# Patient Record
Sex: Male | Born: 1990 | Race: White | Hispanic: No | Marital: Single | Smoking: Never smoker
Health system: Southern US, Community
[De-identification: ages and names within clinical notes are randomized; demographics above are authoritative.]

## PROBLEM LIST (undated history)

## (undated) DIAGNOSIS — F419 Anxiety disorder, unspecified: Secondary | ICD-10-CM

## (undated) DIAGNOSIS — F32A Depression, unspecified: Secondary | ICD-10-CM

## (undated) DIAGNOSIS — E7800 Pure hypercholesterolemia, unspecified: Secondary | ICD-10-CM

## (undated) DIAGNOSIS — N200 Calculus of kidney: Secondary | ICD-10-CM

## (undated) DIAGNOSIS — I10 Essential (primary) hypertension: Secondary | ICD-10-CM

## (undated) HISTORY — DX: Calculus of kidney: N20.0

## (undated) HISTORY — PX: TESTICLE SURGERY: SHX794

## (undated) HISTORY — DX: Morbid (severe) obesity due to excess calories: E66.01

---

## 2020-11-06 ENCOUNTER — Emergency Department (HOSPITAL_BASED_OUTPATIENT_CLINIC_OR_DEPARTMENT_OTHER): Payer: Worker's Compensation

## 2020-11-06 ENCOUNTER — Other Ambulatory Visit: Payer: Self-pay

## 2020-11-06 ENCOUNTER — Emergency Department (HOSPITAL_BASED_OUTPATIENT_CLINIC_OR_DEPARTMENT_OTHER)
Admission: EM | Admit: 2020-11-06 | Discharge: 2020-11-06 | Disposition: A | Discharge status: Home / Self Care | Payer: Worker's Compensation | Attending: Emergency Medicine | Admitting: Emergency Medicine

## 2020-11-06 ENCOUNTER — Encounter (HOSPITAL_BASED_OUTPATIENT_CLINIC_OR_DEPARTMENT_OTHER): Payer: Self-pay

## 2020-11-06 DIAGNOSIS — W000XXA Fall on same level due to ice and snow, initial encounter: Secondary | ICD-10-CM | POA: Insufficient documentation

## 2020-11-06 DIAGNOSIS — M25532 Pain in left wrist: Secondary | ICD-10-CM | POA: Insufficient documentation

## 2020-11-06 DIAGNOSIS — M79602 Pain in left arm: Secondary | ICD-10-CM | POA: Insufficient documentation

## 2020-11-06 DIAGNOSIS — Y990 Civilian activity done for income or pay: Secondary | ICD-10-CM | POA: Insufficient documentation

## 2020-11-06 DIAGNOSIS — I10 Essential (primary) hypertension: Secondary | ICD-10-CM | POA: Insufficient documentation

## 2020-11-06 DIAGNOSIS — M79632 Pain in left forearm: Secondary | ICD-10-CM | POA: Insufficient documentation

## 2020-11-06 DIAGNOSIS — M25512 Pain in left shoulder: Secondary | ICD-10-CM | POA: Insufficient documentation

## 2020-11-06 DIAGNOSIS — Y9289 Other specified places as the place of occurrence of the external cause: Secondary | ICD-10-CM | POA: Diagnosis not present

## 2020-11-06 DIAGNOSIS — W19XXXA Unspecified fall, initial encounter: Secondary | ICD-10-CM

## 2020-11-06 HISTORY — DX: Anxiety disorder, unspecified: F41.9

## 2020-11-06 HISTORY — DX: Essential (primary) hypertension: I10

## 2020-11-06 HISTORY — DX: Pure hypercholesterolemia, unspecified: E78.00

## 2020-11-06 HISTORY — DX: Depression, unspecified: F32.A

## 2020-11-06 MED ORDER — METHOCARBAMOL 500 MG PO TABS: 500.0000 mg | ORAL_TABLET | Freq: Two times a day (BID) | ORAL | 0 refills | Status: AC

## 2020-11-06 NOTE — ED Notes (Signed)
UDS completed 

## 2020-11-06 NOTE — ED Provider Notes (Signed)
[facility] EMERGENCY DEPARTMENT Provider Note   CSN: [id number] Arrival date & time: 11/06/20  0931     History Chief Complaint  Patient presents with  . Arm Pain  . Arm Injury    George Henry is a 30 y.o. male with pertinent past medical history of anxiety, depression, hypertension the presents the emergency department today for fall.  Patient states that he slipped on the ice this morning try to get out to his car, FOOSH injury.  Patient states that he extended his left arm and is  currently having pain in his left wrist, forearm and shoulder.  Denies any blood thinners.  Did not hit his head.  No LOC.  No prior injury to this area.  Did not hit his back.  No other complaints at this time.  Denies any numbness or tingling down to his arm.  Also asking for urine drug screen at this time for work.    HPI     Past Medical History:  Diagnosis Date  . Anxiety   . Depression   . Hypercholesteremia   . Hypertension     There are no problems to display for this patient.   Past Surgical History:  Procedure Laterality Date  . TESTICLE SURGERY         History reviewed. No pertinent family history.  Social History   Tobacco Use  . Smoking status: Never Smoker  . Smokeless tobacco: Never Used  Vaping Use  . Vaping Use: Never used  Substance Use Topics  . Alcohol use: Not Currently  . Drug use: Not Currently    Home Medications Prior to Admission medications   Medication Sig Start Date End Date Taking? Authorizing Provider  methocarbamol (ROBAXIN) 500 MG tablet Take 1 tablet (500 mg total) by mouth 2 (two) times daily. 11/06/20  Yes Farrel Gordon, PA-C    Allergies    Patient has no allergy information on record.  Review of Systems   Review of Systems  Constitutional: Negative for diaphoresis, fatigue and fever.  Eyes: Negative for visual disturbance.  Respiratory: Negative for shortness of breath.   Cardiovascular: Negative for chest pain.   Gastrointestinal: Negative for nausea and vomiting.  Musculoskeletal: Positive for myalgias. Negative for back pain.  Skin: Negative for color change, pallor, rash and wound.  Neurological: Negative for syncope, weakness, light-headedness, numbness and headaches.  Psychiatric/Behavioral: Negative for behavioral problems and confusion.    Physical Exam Updated Vital Signs BP (!) 170/103 (BP Location: Right Arm)   Pulse (!) 106   Temp 98.2 F (36.8 C) (Oral)   Resp 18   Ht 5\' 5"  (1.651 m)   Wt 108.9 kg   SpO2 100%   BMI 39.94 kg/m   Physical Exam Constitutional:      General: He is not in acute distress.    Appearance: Normal appearance. He is not ill-appearing, toxic-appearing or diaphoretic.  HENT:     Head: Normocephalic and atraumatic.  Cardiovascular:     Rate and Rhythm: Normal rate and regular rhythm.     Pulses: Normal pulses.  Pulmonary:     Effort: Pulmonary effort is normal. No respiratory distress.     Breath sounds: Normal breath sounds.  Musculoskeletal:        General: Normal range of motion.       Back:     Comments: Some tenderness to region depicted above. Left upper arm with normal range of motion to shoulder, elbow, wrist and fingers.  Normal  strength throughout, with normal range of motion without any pain.  Slight discomfort to forearm.  No edema noted.  No ecchymosis noted.  Normal sensation throughout.  Cap refill less than 2 seconds on each digit.  Radial pulse 2+.  Skin:    General: Skin is warm and dry.     Capillary Refill: Capillary refill takes less than 2 seconds.  Neurological:     General: No focal deficit present.     Mental Status: He is alert and oriented to person, place, and time.     Cranial Nerves: No cranial nerve deficit.     Sensory: No sensory deficit.     Motor: No weakness.  Psychiatric:        Mood and Affect: Mood normal.        Behavior: Behavior normal.        Thought Content: Thought content normal.     ED  Results / Procedures / Treatments   Labs (all labs ordered are listed, but only abnormal results are displayed) Labs Reviewed  RAPID URINE DRUG SCREEN, HOSP PERFORMED    EKG None  Radiology DG Forearm Left  Result Date: 11/06/2020 CLINICAL DATA:  Fall with left arm pain EXAM: LEFT FOREARM - 2 VIEW; LEFT WRIST - COMPLETE 3+ VIEW COMPARISON:  None. FINDINGS: There is no evidence of fracture or other focal bone lesions. No significant arthropathy. Soft tissue swelling of the mid forearm. IMPRESSION: No osseous abnormality of the left forearm or wrist. Soft tissue swelling of the mid forearm. Electronically Signed   By: Duanne Guess D.O.   On: 11/06/2020 10:27   DG Wrist Complete Left  Result Date: 11/06/2020 CLINICAL DATA:  Fall with left arm pain EXAM: LEFT FOREARM - 2 VIEW; LEFT WRIST - COMPLETE 3+ VIEW COMPARISON:  None. FINDINGS: There is no evidence of fracture or other focal bone lesions. No significant arthropathy. Soft tissue swelling of the mid forearm. IMPRESSION: No osseous abnormality of the left forearm or wrist. Soft tissue swelling of the mid forearm. Electronically Signed   By: Duanne Guess D.O.   On: 11/06/2020 10:27   DG Shoulder Left  Result Date: 11/06/2020 CLINICAL DATA:  Fall with left arm pain EXAM: LEFT HUMERUS - 2+ VIEW; LEFT SHOULDER - 2+ VIEW COMPARISON:  None. FINDINGS: There is no evidence of fracture or other focal bone lesions. No significant arthropathy. Soft tissues are unremarkable. IMPRESSION: No acute osseous abnormality of the left shoulder or humerus. Electronically Signed   By: Duanne Guess D.O.   On: 11/06/2020 10:29   DG Humerus Left  Result Date: 11/06/2020 CLINICAL DATA:  Fall with left arm pain EXAM: LEFT HUMERUS - 2+ VIEW; LEFT SHOULDER - 2+ VIEW COMPARISON:  None. FINDINGS: There is no evidence of fracture or other focal bone lesions. No significant arthropathy. Soft tissues are unremarkable. IMPRESSION: No acute osseous abnormality of  the left shoulder or humerus. Electronically Signed   By: Duanne Guess D.O.   On: 11/06/2020 10:29    Procedures Procedures (including critical care time)  Medications Ordered in ED Medications - No data to display  ED Course  I have reviewed the triage vital signs and the nursing notes.  Pertinent labs & imaging results that were available during my care of the patient were reviewed by me and considered in my medical decision making (see chart for details).    MDM Rules/Calculators/A&P  George Henry is a 30 y.o. male with pertinent past medical history of anxiety, depression, hypertension the presents the emergency department today for fall.  Plain films negative, patient is distally neurovascular intact with normal range of motion.  No tenderness to wrist, slight tenderness to forearm.  Do not think patient has a sprain, patient has no pain when he moves his wrist.  Did apply some Ace wrap to forearm.  Did not hit his head.  RICE therapy discussed, will prescribe Robaxin for muscle aches.  Patient will follow-up with sports medicine.  Doubt need for further emergent work up at this time. I explained the diagnosis and have given explicit precautions to return to the ER including for any other new or worsening symptoms. The patient understands and accepts the medical plan as it's been dictated and I have answered their questions. Discharge instructions concerning home care and prescriptions have been given. The patient is STABLE and is discharged to home in good condition.  Final Clinical Impression(s) / ED Diagnoses Final diagnoses:  Fall, initial encounter    Rx / DC Orders ED Discharge Orders         Ordered    methocarbamol (ROBAXIN) 500 MG tablet  2 times daily        11/06/20 1122           Farrel Gordon, PA-C 11/06/20 1157    Melene Plan, DO 11/06/20 1226

## 2020-11-06 NOTE — ED Triage Notes (Signed)
Pt reports that he was at work in the parking lot and went to get into his car and slipped on the ice and fell catching himself with Left hand and now has Left shoulder/arm/chest/back wrist pain. Pt reports that he will need a drug screen completed for his employer.

## 2020-11-06 NOTE — Discharge Instructions (Addendum)
Please read and follow all provided instructions.  You have been seen today for fall.  Please follow-up with your PCP or Dr. Jordan Likes.  Work note provided.  Take the Robaxin as directed for muscle pain, there are multiple side effects for this please go over this with your pharmacist when you pick this up.  If you have any new worsening concerning symptoms he can back to the emergency department.  Your urine drug screen will be sent to your work.  Tests performed today include: An x-ray of the affected area - does NOT show any broken bones or dislocations.  Vital signs. See below for your results today.   Home care instructions: -- *PRICE in the first 24-48 hours after injury: Protect (with brace, splint, sling), if given by your provider Rest Ice- Do not apply ice pack directly to your skin, place towel or similar between your skin and ice/ice pack. Apply ice for 20 min, then remove for 40 min while awake Compression- Wear brace, elastic bandage, splint as directed by your provider Elevate affected extremity above the level of your heart when not walking around for the first 24-48 hours   Use Ibuprofen (Motrin/Advil) 600mg  every 6 hours as needed for pain (do not exceed max dose in 24 hours, 2400mg )  Follow-up instructions: Please follow-up with your primary care provider or the provided orthopedic physician (bone specialist) if you continue to have significant pain in 1 week. In this case you may have a more severe injury that requires further care.   Return instructions:  Please return if your toes or feet are numb or tingling, appear gray or blue, or you have severe pain (also elevate the leg and loosen splint or wrap if you were given one) Please return to the Emergency Department if you experience worsening symptoms.  Please return if you have any other emergent concerns. Additional Information:  Your vital signs today were: BP (!) 170/103 (BP Location: Right Arm)    Pulse (!) 106     Temp 98.2 F (36.8 C) (Oral)    Resp 18    Ht 5\' 5"  (1.651 m)    Wt 108.9 kg    SpO2 100%    BMI 39.94 kg/m  If your blood pressure (BP) was elevated above 135/85 this visit, please have this repeated by your doctor within one month. ---------------

## 2020-11-08 ENCOUNTER — Telehealth: Payer: Self-pay | Admitting: Family Medicine

## 2020-11-08 NOTE — Telephone Encounter (Signed)
Called pt to schedule ED follow up on fall & arm injury--no answer, message left.  --glh

## 2022-04-01 IMAGING — DX DG WRIST COMPLETE 3+V*L*
4 series · 4 of 4 positions shown · non-contrast
Comparison: None.

CLINICAL DATA: Fall with left arm pain

EXAM:
LEFT FOREARM - 2 VIEW; LEFT WRIST - COMPLETE 3+ VIEW

[wrist pa]
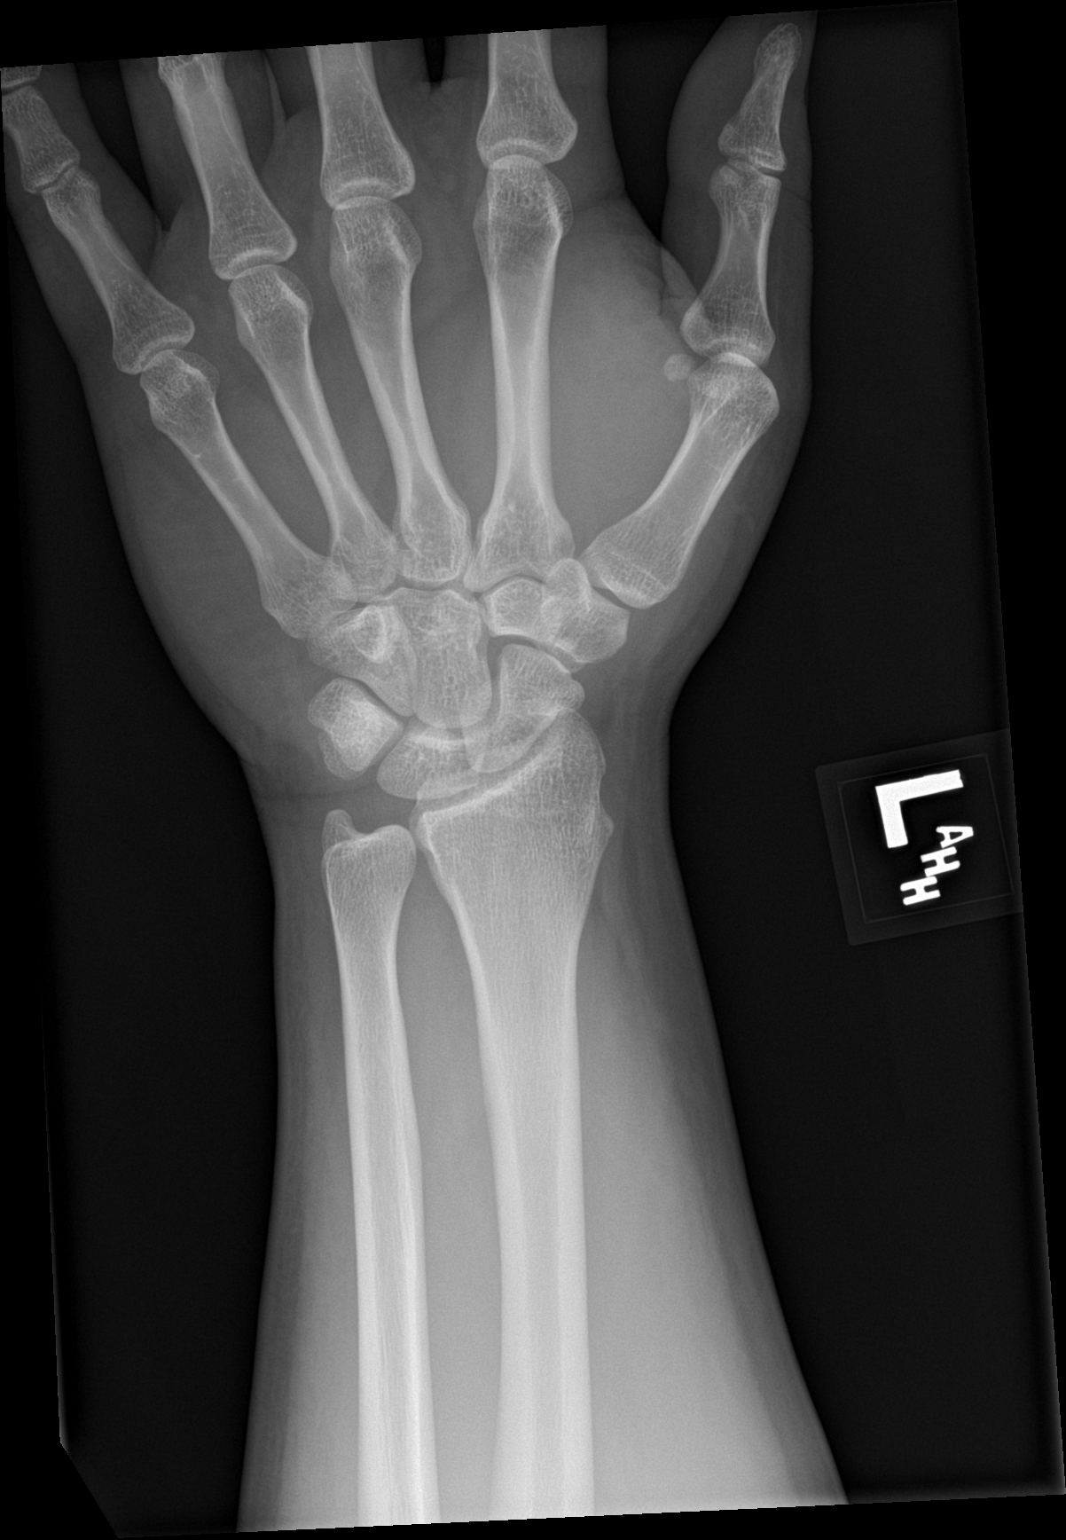

[wrist obl]
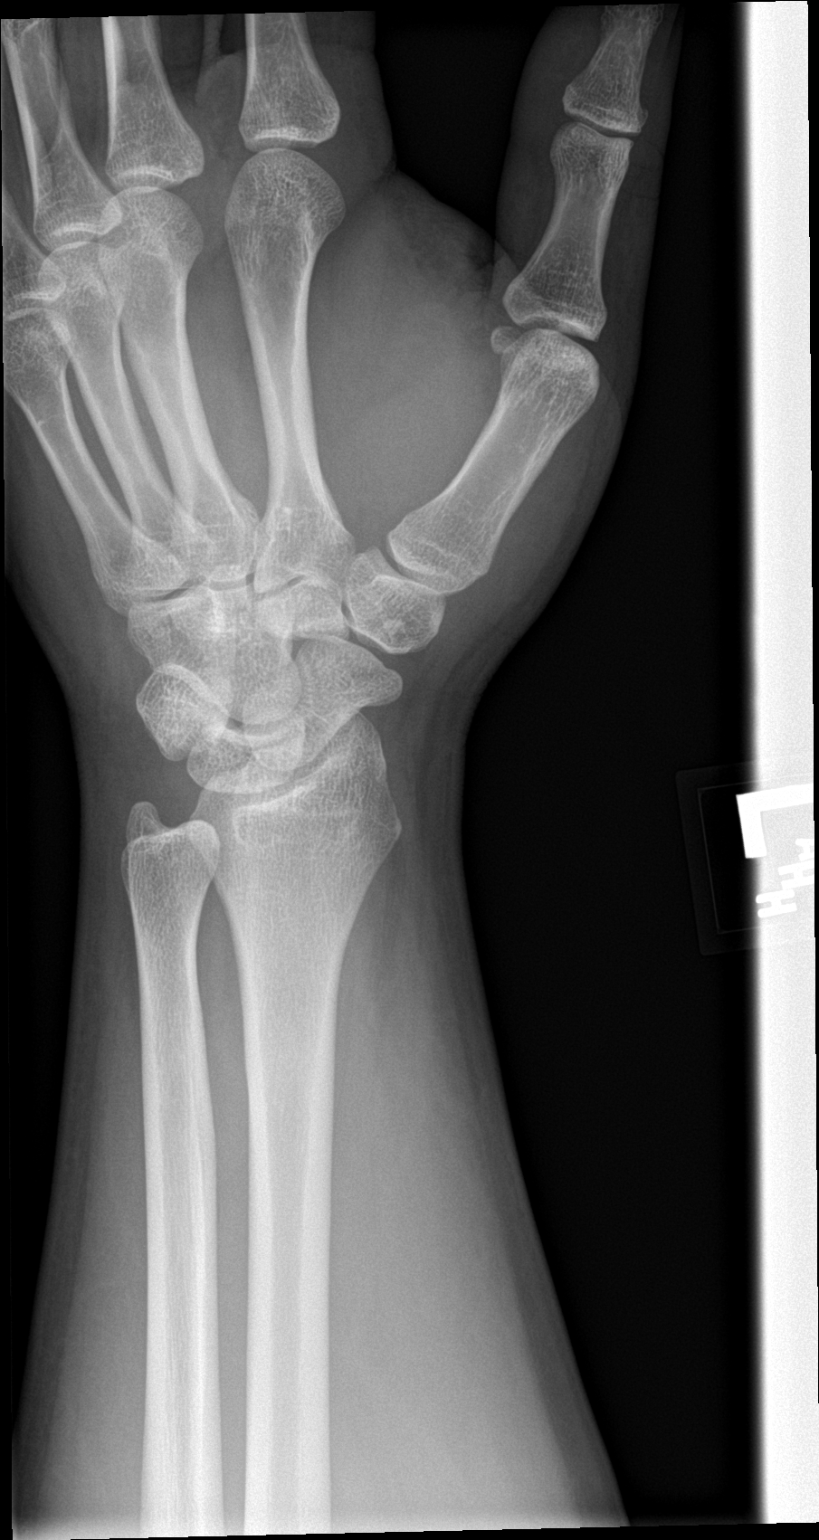

[wrist lat]
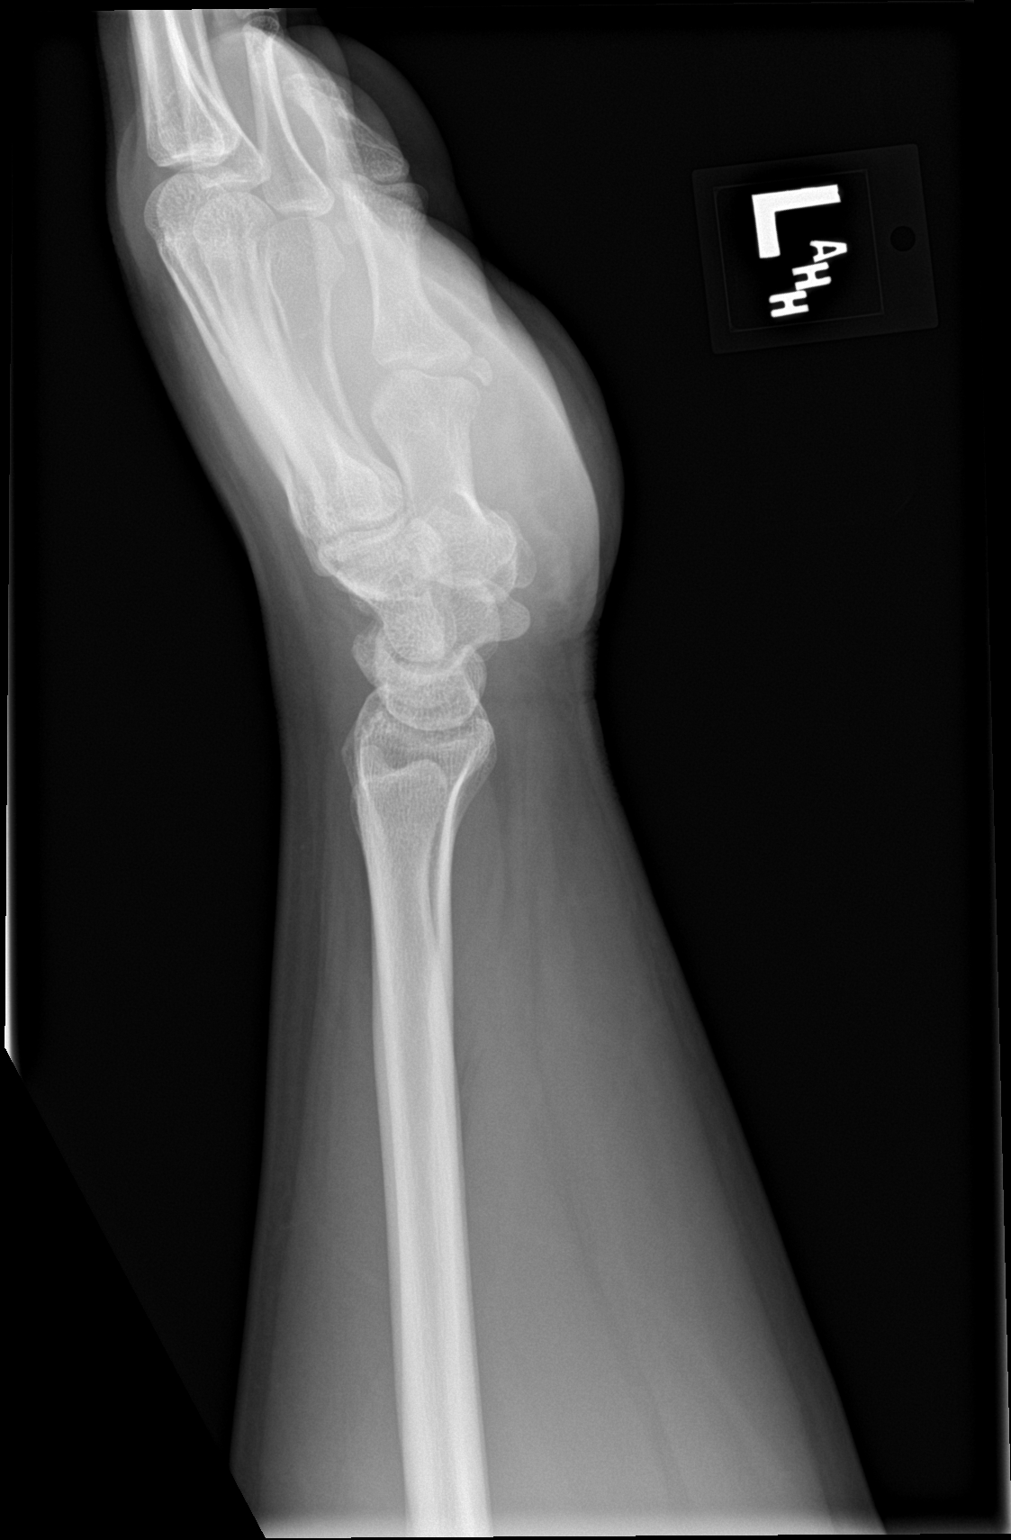

[wrist navicular]
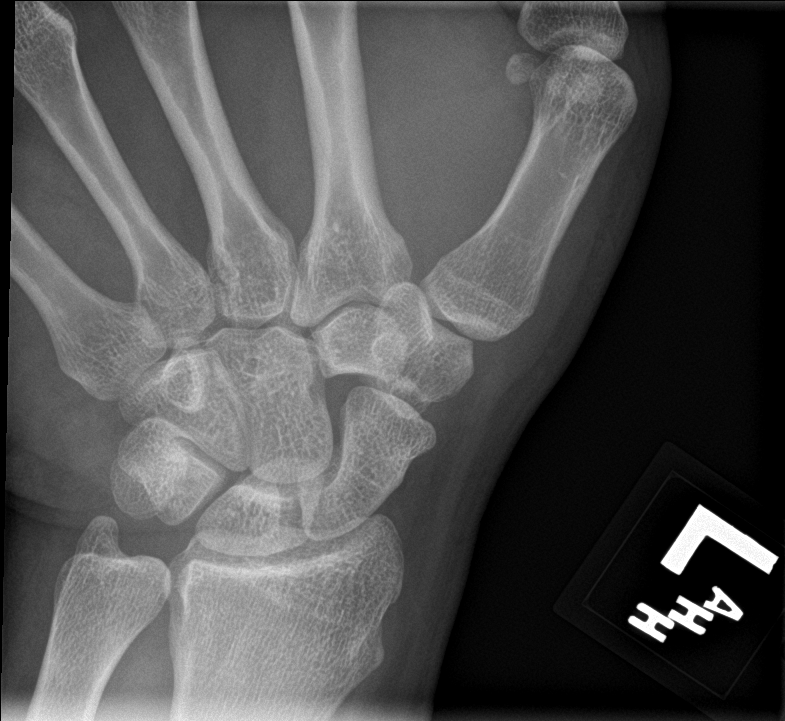

[4 of 4 positions shown; findings below may reference images not displayed]

FINDINGS: There is no evidence of fracture or other focal bone lesions. No
significant arthropathy. Soft tissue swelling of the mid forearm.
IMPRESSION: No osseous abnormality of the left forearm or wrist. Soft tissue
swelling of the mid forearm.

## 2022-04-24 DIAGNOSIS — H9203 Otalgia, bilateral: Secondary | ICD-10-CM | POA: Diagnosis not present

## 2022-04-24 DIAGNOSIS — J029 Acute pharyngitis, unspecified: Secondary | ICD-10-CM | POA: Diagnosis not present

## 2022-05-16 DIAGNOSIS — J029 Acute pharyngitis, unspecified: Secondary | ICD-10-CM | POA: Diagnosis not present

## 2022-07-11 DIAGNOSIS — I10 Essential (primary) hypertension: Secondary | ICD-10-CM | POA: Diagnosis not present

## 2022-07-11 DIAGNOSIS — Z23 Encounter for immunization: Secondary | ICD-10-CM | POA: Diagnosis not present

## 2022-07-11 DIAGNOSIS — E782 Mixed hyperlipidemia: Secondary | ICD-10-CM | POA: Diagnosis not present

## 2022-07-11 DIAGNOSIS — Z6841 Body Mass Index (BMI) 40.0 and over, adult: Secondary | ICD-10-CM | POA: Diagnosis not present

## 2022-08-11 DIAGNOSIS — M25539 Pain in unspecified wrist: Secondary | ICD-10-CM | POA: Diagnosis not present

## 2022-08-11 DIAGNOSIS — G8929 Other chronic pain: Secondary | ICD-10-CM | POA: Diagnosis not present

## 2022-09-15 DIAGNOSIS — R6889 Other general symptoms and signs: Secondary | ICD-10-CM | POA: Diagnosis not present

## 2022-09-15 DIAGNOSIS — E782 Mixed hyperlipidemia: Secondary | ICD-10-CM | POA: Diagnosis not present

## 2022-09-15 DIAGNOSIS — I10 Essential (primary) hypertension: Secondary | ICD-10-CM | POA: Diagnosis not present

## 2022-09-15 DIAGNOSIS — Z20822 Contact with and (suspected) exposure to covid-19: Secondary | ICD-10-CM | POA: Diagnosis not present

## 2022-12-29 DIAGNOSIS — E782 Mixed hyperlipidemia: Secondary | ICD-10-CM | POA: Diagnosis not present

## 2022-12-29 DIAGNOSIS — Z20822 Contact with and (suspected) exposure to covid-19: Secondary | ICD-10-CM | POA: Diagnosis not present

## 2022-12-29 DIAGNOSIS — R6889 Other general symptoms and signs: Secondary | ICD-10-CM | POA: Diagnosis not present

## 2022-12-29 DIAGNOSIS — I10 Essential (primary) hypertension: Secondary | ICD-10-CM | POA: Diagnosis not present

## 2023-02-03 ENCOUNTER — Encounter: Payer: Self-pay | Admitting: *Deleted

## 2023-02-17 DIAGNOSIS — K029 Dental caries, unspecified: Secondary | ICD-10-CM | POA: Diagnosis not present

## 2023-02-17 DIAGNOSIS — K0510 Chronic gingivitis, plaque induced: Secondary | ICD-10-CM | POA: Diagnosis not present

## 2023-02-17 DIAGNOSIS — K0889 Other specified disorders of teeth and supporting structures: Secondary | ICD-10-CM | POA: Diagnosis not present

## 2023-06-08 DIAGNOSIS — R059 Cough, unspecified: Secondary | ICD-10-CM | POA: Diagnosis not present

## 2023-06-08 DIAGNOSIS — R0989 Other specified symptoms and signs involving the circulatory and respiratory systems: Secondary | ICD-10-CM | POA: Diagnosis not present

## 2023-06-08 DIAGNOSIS — U071 COVID-19: Secondary | ICD-10-CM | POA: Diagnosis not present

## 2023-09-15 DIAGNOSIS — R051 Acute cough: Secondary | ICD-10-CM | POA: Diagnosis not present

## 2023-09-15 DIAGNOSIS — J069 Acute upper respiratory infection, unspecified: Secondary | ICD-10-CM | POA: Diagnosis not present

## 2023-09-17 DIAGNOSIS — N201 Calculus of ureter: Secondary | ICD-10-CM | POA: Diagnosis not present

## 2023-09-17 DIAGNOSIS — R109 Unspecified abdominal pain: Secondary | ICD-10-CM | POA: Diagnosis not present

## 2023-10-09 DIAGNOSIS — R109 Unspecified abdominal pain: Secondary | ICD-10-CM | POA: Diagnosis not present

## 2023-10-09 DIAGNOSIS — Z79899 Other long term (current) drug therapy: Secondary | ICD-10-CM | POA: Diagnosis not present

## 2023-10-09 DIAGNOSIS — N132 Hydronephrosis with renal and ureteral calculous obstruction: Secondary | ICD-10-CM | POA: Diagnosis not present

## 2023-12-30 DIAGNOSIS — R059 Cough, unspecified: Secondary | ICD-10-CM | POA: Diagnosis not present

## 2023-12-30 DIAGNOSIS — R0981 Nasal congestion: Secondary | ICD-10-CM | POA: Diagnosis not present

## 2023-12-30 DIAGNOSIS — B349 Viral infection, unspecified: Secondary | ICD-10-CM | POA: Diagnosis not present

## 2024-02-05 DIAGNOSIS — K029 Dental caries, unspecified: Secondary | ICD-10-CM | POA: Diagnosis not present

## 2024-02-05 DIAGNOSIS — K0889 Other specified disorders of teeth and supporting structures: Secondary | ICD-10-CM | POA: Diagnosis not present

## 2024-03-28 ENCOUNTER — Ambulatory Visit (HOSPITAL_BASED_OUTPATIENT_CLINIC_OR_DEPARTMENT_OTHER)
Admission: RE | Admit: 2024-03-28 | Discharge: 2024-03-28 | Disposition: A | Discharge status: Home / Self Care | Source: Ambulatory Visit | Attending: Family Medicine | Admitting: Family Medicine

## 2024-03-28 ENCOUNTER — Encounter (HOSPITAL_BASED_OUTPATIENT_CLINIC_OR_DEPARTMENT_OTHER): Payer: Self-pay

## 2024-03-28 VITALS — BP 147/92 | HR 86 | Temp 97.5°F | Resp 18

## 2024-03-28 DIAGNOSIS — J069 Acute upper respiratory infection, unspecified: Secondary | ICD-10-CM | POA: Diagnosis not present

## 2024-03-28 DIAGNOSIS — J3489 Other specified disorders of nose and nasal sinuses: Secondary | ICD-10-CM

## 2024-03-28 DIAGNOSIS — J029 Acute pharyngitis, unspecified: Secondary | ICD-10-CM | POA: Diagnosis not present

## 2024-03-28 DIAGNOSIS — R0981 Nasal congestion: Secondary | ICD-10-CM | POA: Diagnosis not present

## 2024-03-28 DIAGNOSIS — B9789 Other viral agents as the cause of diseases classified elsewhere: Secondary | ICD-10-CM | POA: Diagnosis not present

## 2024-03-28 LAB — POCT RAPID STREP A (OFFICE): Rapid Strep A Screen: NEGATIVE

## 2024-03-28 MED ORDER — FLUTICASONE PROPIONATE 50 MCG/ACT NA SUSP: 1.0000 | Freq: Two times a day (BID) | NASAL | 0 refills | Status: AC | PRN

## 2024-03-28 MED ORDER — TRIAMCINOLONE ACETONIDE 40 MG/ML IJ SUSP
40.0000 mg | Freq: Once | INTRAMUSCULAR | Status: AC
  Administered 2024-03-28: 40 mg via INTRAMUSCULAR

## 2024-03-28 MED ORDER — SINUS RINSE BOTTLE KIT NA PACK: 1.0000 | PACK | Freq: Two times a day (BID) | NASAL | 0 refills | Status: AC | PRN

## 2024-03-28 NOTE — ED Triage Notes (Signed)
 Pt c/o sore throat started Friday, nasal congestion, coughing. Pt son also has similar symptoms. No fever. Pt has taken allergy medication.

## 2024-03-28 NOTE — Discharge Instructions (Addendum)
 Exam is most consistent with viral upper respiratory infection and sinus pressure: Patient needs sinus rinses.  Kenalog 40 mg IM now to open his sinuses.  Antibiotics not needed.  Encouraged fluticasone nasal spray, 1 spray into each nostril once or twice daily for nasal congestion.   Sore throat: Rapid strep is negative.  Throat culture sent.  Will adjust the plan of care, if needed once the culture results.  Make an appointment to see primary care and get worked up.  May need referral to ENT.

## 2024-03-28 NOTE — ED Provider Notes (Signed)
 [facility]    CSN: [id number] Arrival date & time: 03/28/24  1257      History   Chief Complaint Chief Complaint  Patient presents with   Nasal Congestion    Having sinus pain and drainage. coughing, chest congestion. - Entered by patient    HPI George Henry is a 33 y.o. male.   Patient reports chronic sinusitis almost monthly.  He often goes to the emergency room and gets antibiotics.  He says this has been going on for 6 months or a year.  On Friday, 03/25/2024, he developed nasal congestion and cough.  He gets some facial pressure with this and he now has a sore throat.  Allergy medicines have not helped his symptoms.     Past Medical History:  Diagnosis Date   Anxiety    Depression    Hypercholesteremia    Hypertension     There are no active problems to display for this patient.   Past Surgical History:  Procedure Laterality Date   TESTICLE SURGERY         Home Medications    Prior to Admission medications   Medication Sig Start Date End Date Taking? Authorizing Provider  fluticasone (FLONASE) 50 MCG/ACT nasal spray Place 1 spray into both nostrils 2 (two) times daily as needed for rhinitis. 03/28/24 04/27/24 Yes Guss Legacy, FNP  Hypertonic Nasal Wash (SINUS RINSE BOTTLE KIT) PACK Place 1 each into the nose 2 (two) times daily as needed (nasal congestion/pressure). 03/28/24  Yes Guss Legacy, FNP  methocarbamol  (ROBAXIN ) 500 MG tablet Take 1 tablet (500 mg total) by mouth 2 (two) times daily. 11/06/20   Suella Emmer, PA-C    Family History History reviewed. No pertinent family history.  Social History Social History   Tobacco Use   Smoking status: Never   Smokeless tobacco: Never  Vaping Use   Vaping status: Never Used  Substance Use Topics   Alcohol use: Not Currently   Drug use: Not Currently     Allergies   Patient has no known allergies.   Review of Systems Review of Systems  Constitutional:  Negative for chills and fever.   HENT:  Positive for congestion, postnasal drip, rhinorrhea, sinus pressure, sinus pain and sore throat. Negative for ear pain.   Eyes:  Negative for pain and visual disturbance.  Respiratory:  Positive for cough.   Cardiovascular:  Negative for chest pain and palpitations.  Gastrointestinal:  Negative for abdominal pain, constipation, diarrhea, nausea and vomiting.  Genitourinary:  Negative for dysuria and hematuria.  Musculoskeletal:  Negative for arthralgias and back pain.  Skin:  Negative for color change and rash.  Neurological:  Negative for seizures and syncope.  All other systems reviewed and are negative.    Physical Exam Triage Vital Signs ED Triage Vitals  Encounter Vitals Group     BP 03/28/24 1344 (!) 147/92     Systolic BP Percentile --      Diastolic BP Percentile --      Pulse Rate 03/28/24 1344 86     Resp 03/28/24 1344 18     Temp 03/28/24 1344 (!) 97.5 F (36.4 C)     Temp Source 03/28/24 1344 Oral     SpO2 03/28/24 1344 98 %     Weight --      Height --      Head Circumference --      Peak Flow --      Pain Score 03/28/24 1343 0  Pain Loc --      Pain Education --      Exclude from Growth Chart --    No data found.  Updated Vital Signs BP (!) 147/92 (BP Location: Right Arm)   Pulse 86   Temp (!) 97.5 F (36.4 C) (Oral)   Resp 18   SpO2 98%   Visual Acuity Right Eye Distance:   Left Eye Distance:   Bilateral Distance:    Right Eye Near:   Left Eye Near:    Bilateral Near:     Physical Exam Vitals and nursing note reviewed.  Constitutional:      General: He is not in acute distress.    Appearance: He is well-developed. He is not ill-appearing or toxic-appearing.  HENT:     Head: Normocephalic and atraumatic.     Right Ear: Hearing, tympanic membrane, ear canal and external ear normal.     Left Ear: Hearing, tympanic membrane, ear canal and external ear normal.     Nose: Congestion and rhinorrhea present. Rhinorrhea is clear.      Right Sinus: Maxillary sinus tenderness present. No frontal sinus tenderness.     Left Sinus: Maxillary sinus tenderness present. No frontal sinus tenderness.     Mouth/Throat:     Lips: Pink.     Mouth: Mucous membranes are moist.     Pharynx: Uvula midline. No oropharyngeal exudate or posterior oropharyngeal erythema.     Tonsils: No tonsillar exudate.  Eyes:     Conjunctiva/sclera: Conjunctivae normal.     Pupils: Pupils are equal, round, and reactive to light.  Cardiovascular:     Rate and Rhythm: Normal rate and regular rhythm.     Heart sounds: S1 normal and S2 normal. No murmur heard. Pulmonary:     Effort: Pulmonary effort is normal. No respiratory distress.     Breath sounds: Normal breath sounds. No decreased breath sounds, wheezing, rhonchi or rales.  Abdominal:     General: Bowel sounds are normal.     Palpations: Abdomen is soft.     Tenderness: There is no abdominal tenderness.  Musculoskeletal:        General: No swelling.     Cervical back: Neck supple.  Lymphadenopathy:     Head:     Right side of head: No submental, submandibular, tonsillar, preauricular or posterior auricular adenopathy.     Left side of head: No submental, submandibular, tonsillar, preauricular or posterior auricular adenopathy.     Cervical: No cervical adenopathy.     Right cervical: No superficial cervical adenopathy.    Left cervical: No superficial cervical adenopathy.  Skin:    General: Skin is warm and dry.     Capillary Refill: Capillary refill takes less than 2 seconds.     Findings: No rash.  Neurological:     Mental Status: He is alert and oriented to person, place, and time.  Psychiatric:        Mood and Affect: Mood normal.      UC Treatments / Results  Labs (all labs ordered are listed, but only abnormal results are displayed) Labs Reviewed  POCT RAPID STREP A (OFFICE) - Normal  CULTURE, GROUP A STREP [facility])    EKG   Radiology No results  found.  Procedures Procedures (including critical care time)  Medications Ordered in UC Medications  triamcinolone acetonide (KENALOG-40) injection 40 mg (40 mg Intramuscular Given 03/28/24 1439)    Initial Impression / Assessment and Plan / UC Course  I have reviewed the triage vital signs and the nursing notes.  Pertinent labs & imaging results that were available during my care of the patient were reviewed by me and considered in my medical decision making (see chart for details).  Plan of Care: Viral upper respiratory infection and sinus pressure: Exam is most consistent with just a viral upper respiratory infection, not sinusitis.  Will provide Kenalog 40 mg to try to open his sinuses.  He needs to do sinus rinses twice daily.  Fluticasone nasal spray, 1 spray into each nostril at least once daily and could do twice daily if needed.  Sore throat: Rapid strep was negative.  Throat culture sent.  Will adjust the plan of care if needed once the culture results.  Patient needs to get connected with primary care.  He may need to see ENT for chronic sinus pain and pressure.  I reviewed the plan of care with the patient and/or the patient's guardian.  The patient and/or guardian had time to ask questions and acknowledged that the questions were answered.  I provided instruction on symptoms or reasons to return here or to go to an ER, if symptoms/condition did not improve, worsened or if new symptoms occurred.  Final Clinical Impressions(s) / UC Diagnoses   Final diagnoses:  Sore throat  Viral URI with cough  Sinus pressure     Discharge Instructions      Exam is most consistent with viral upper respiratory infection and sinus pressure: Patient needs sinus rinses.  Kenalog 40 mg IM now to open his sinuses.  Antibiotics not needed.  Encouraged fluticasone nasal spray, 1 spray into each nostril once or twice daily for nasal congestion.   Sore throat: Rapid strep is negative.  Throat  culture sent.  Will adjust the plan of care, if needed once the culture results.  Make an appointment to see primary care and get worked up.  May need referral to ENT.   ED Prescriptions     Medication Sig Dispense Auth. Provider   Hypertonic Nasal Wash (SINUS RINSE BOTTLE KIT) PACK Place 1 each into the nose 2 (two) times daily as needed (nasal congestion/pressure). 1 each Guss Legacy, FNP   fluticasone (FLONASE) 50 MCG/ACT nasal spray Place 1 spray into both nostrils 2 (two) times daily as needed for rhinitis. 17 mL Guss Legacy, FNP      PDMP not reviewed this encounter.   Guss Legacy, FNP 03/28/24 [phone]

## 2024-03-31 ENCOUNTER — Ambulatory Visit: Payer: Self-pay

## 2024-03-31 LAB — CULTURE, GROUP A STREP (THRC)

## 2024-04-03 NOTE — Progress Notes (Signed)
 Throat culture is Negative.  Patient updated via VM message.  Antibiotics not needed.  Instructed to return, if symptoms are not improving.

## 2024-04-04 ENCOUNTER — Encounter (HOSPITAL_BASED_OUTPATIENT_CLINIC_OR_DEPARTMENT_OTHER): Payer: Self-pay | Admitting: Family Medicine

## 2024-04-04 ENCOUNTER — Ambulatory Visit (INDEPENDENT_AMBULATORY_CARE_PROVIDER_SITE_OTHER): Admitting: Family Medicine

## 2024-04-04 VITALS — BP 155/103 | HR 93 | Temp 98.3°F | Resp 16 | Ht 65.35 in | Wt 240.3 lb

## 2024-04-04 DIAGNOSIS — N200 Calculus of kidney: Secondary | ICD-10-CM | POA: Insufficient documentation

## 2024-04-04 DIAGNOSIS — I10 Essential (primary) hypertension: Secondary | ICD-10-CM | POA: Diagnosis not present

## 2024-04-04 DIAGNOSIS — E785 Hyperlipidemia, unspecified: Secondary | ICD-10-CM | POA: Insufficient documentation

## 2024-04-04 NOTE — Assessment & Plan Note (Signed)
 Continue liberal water consumption and Tamsulosin.  Urology referral to assist.  He declines any BP rx at this time.  He declines the need for any dietary counseling.  He feels confident he can get his weight back down on his own.  He seems to understand the importance of making sure his home BP is controlled.  He understands he is running a real risk of MI, stroke, etc if his home BP is consistently uncontrolled.

## 2024-04-04 NOTE — Progress Notes (Unsigned)
 Established Patient Office Visit  Subjective   Patient ID: George Henry, male    DOB: 1990/11/18  Age: 33 y.o. MRN: [id number]  Chief Complaint  Patient presents with   Establish Care    Pt. Here to establish care.    F/u as above.  He knows his BP and lipids have been elevated for some time.  He states he has lost lots of weight at times in the past, but then puts in back on.  He states his BP comes down with the weight loss, and that he's working hard on weight loss now.  Also has been dealing with a kidney stone.    Past Medical History:  Diagnosis Date   Anxiety    Hypercholesteremia    Hypertension    Kidney stone    Morbid obesity (HCC)    Comorbidities: HTN, Lipids    Outpatient Encounter Medications as of 04/04/2024  Medication Sig   fluticasone  (FLONASE ) 50 MCG/ACT nasal spray Place 1 spray into both nostrils 2 (two) times daily as needed for rhinitis.   Hypertonic Nasal Wash (SINUS RINSE BOTTLE KIT) PACK Place 1 each into the nose 2 (two) times daily as needed (nasal congestion/pressure).   ibuprofen (ADVIL) 600 MG tablet Take 1 tablet by mouth.   tamsulosin (FLOMAX) 0.4 MG CAPS capsule Take 0.4 mg by mouth at bedtime.   traMADol (ULTRAM) 50 MG tablet TAKE 1 TABLET BY MOUTH EVERY 6 HOURS AS NEEDED FOR MODERATE TO SEVERE PAIN   methocarbamol  (ROBAXIN ) 500 MG tablet Take 1 tablet (500 mg total) by mouth 2 (two) times daily. (Patient not taking: Reported on 04/04/2024)   No facility-administered encounter medications on file as of 04/04/2024.    Social History   Tobacco Use   Smoking status: Never   Smokeless tobacco: Never  Vaping Use   Vaping status: Never Used  Substance Use Topics   Alcohol use: Not Currently   Drug use: Not Currently    {History (Optional):23778}  Review of Systems  Constitutional:  Negative for diaphoresis, fever, malaise/fatigue and weight loss.  Respiratory:  Negative for cough, shortness of breath and wheezing.   Cardiovascular:   Negative for chest pain, palpitations, orthopnea, claudication, leg swelling and PND.      Objective:     BP (!) 155/103 (BP Location: Right Arm, Patient Position: Standing, Cuff Size: Large)   Pulse 93   Temp 98.3 F (36.8 C) (Oral)   Resp 16   Ht 5' 5.35 (1.66 m)   Wt 240 lb 4.8 oz (109 kg)   SpO2 96%   BMI 39.56 kg/m  {Vitals History (Optional):23777}  Physical Exam Constitutional:      General: He is not in acute distress.    Appearance: Normal appearance. He is obese.  HENT:     Head: Normocephalic.  Neck:     Vascular: No carotid bruit.   Cardiovascular:     Rate and Rhythm: Normal rate and regular rhythm.     Pulses: Normal pulses.     Heart sounds: Normal heart sounds.  Pulmonary:     Effort: Pulmonary effort is normal.     Breath sounds: Normal breath sounds.  Abdominal:     General: Bowel sounds are normal.     Palpations: Abdomen is soft.   Musculoskeletal:     Cervical back: Neck supple. No tenderness.     Right lower leg: No edema.     Left lower leg: No edema.   Neurological:  Mental Status: He is alert.      No results found for any visits on 04/04/24.  {Labs (Optional):23779}  The ASCVD Risk score (Arnett DK, et al., 2019) failed to calculate for the following reasons:   The 2019 ASCVD risk score is only valid for ages 85 to 22    Assessment & Plan:  Kidney stone Assessment & Plan: Continue liberal water consumption and Tamsulosin.  Urology referral to assist.  He declines any BP rx at this time.  He declines the need for any dietary counseling.  He feels confident he can get his weight back down on his own.  He seems to understand the importance of making sure his home BP is controlled.  He understands he is running a real risk of MI, stroke, etc if his home BP is consistently uncontrolled.  Orders: -     Ambulatory referral to Urology  Primary hypertension -     CBC with Differential/Platelet; Future -     Comprehensive  metabolic panel with GFR; Future  Dyslipidemia -     Lipid panel; Future    Return in about 3 months (around 07/05/2024) for physical.    Clarnce Crow Reece Cane., MD

## 2024-05-24 DIAGNOSIS — K029 Dental caries, unspecified: Secondary | ICD-10-CM | POA: Diagnosis not present

## 2024-06-22 DIAGNOSIS — J029 Acute pharyngitis, unspecified: Secondary | ICD-10-CM | POA: Diagnosis not present

## 2024-06-22 DIAGNOSIS — I10 Essential (primary) hypertension: Secondary | ICD-10-CM | POA: Diagnosis not present

## 2024-06-22 DIAGNOSIS — K219 Gastro-esophageal reflux disease without esophagitis: Secondary | ICD-10-CM | POA: Diagnosis not present

## 2024-06-22 DIAGNOSIS — J028 Acute pharyngitis due to other specified organisms: Secondary | ICD-10-CM | POA: Diagnosis not present

## 2024-07-05 ENCOUNTER — Encounter (HOSPITAL_BASED_OUTPATIENT_CLINIC_OR_DEPARTMENT_OTHER): Admitting: Family Medicine
